# Patient Record
Sex: Female | Born: 1950 | Race: White | Hispanic: No | Marital: Married | Smoking: Former smoker
Health system: Southern US, Community
[De-identification: ages and names within clinical notes are randomized; demographics above are authoritative.]

## PROBLEM LIST (undated history)

## (undated) DIAGNOSIS — I10 Essential (primary) hypertension: Secondary | ICD-10-CM

## (undated) DIAGNOSIS — G4730 Sleep apnea, unspecified: Secondary | ICD-10-CM

## (undated) DIAGNOSIS — C801 Malignant (primary) neoplasm, unspecified: Secondary | ICD-10-CM

## (undated) DIAGNOSIS — F419 Anxiety disorder, unspecified: Secondary | ICD-10-CM

## (undated) DIAGNOSIS — IMO0001 Reserved for inherently not codable concepts without codable children: Secondary | ICD-10-CM

## (undated) DIAGNOSIS — J449 Chronic obstructive pulmonary disease, unspecified: Secondary | ICD-10-CM

## (undated) DIAGNOSIS — F32A Depression, unspecified: Secondary | ICD-10-CM

## (undated) DIAGNOSIS — F329 Major depressive disorder, single episode, unspecified: Secondary | ICD-10-CM

## (undated) DIAGNOSIS — R51 Headache: Secondary | ICD-10-CM

## (undated) DIAGNOSIS — D649 Anemia, unspecified: Secondary | ICD-10-CM

## (undated) DIAGNOSIS — K589 Irritable bowel syndrome without diarrhea: Secondary | ICD-10-CM

## (undated) DIAGNOSIS — Z5189 Encounter for other specified aftercare: Secondary | ICD-10-CM

## (undated) DIAGNOSIS — K219 Gastro-esophageal reflux disease without esophagitis: Secondary | ICD-10-CM

## (undated) DIAGNOSIS — R0602 Shortness of breath: Secondary | ICD-10-CM

## (undated) HISTORY — PX: JOINT REPLACEMENT: SHX530

## (undated) HISTORY — PX: TENDON REPAIR: SHX5111

## (undated) HISTORY — PX: CARDIOVASCULAR STRESS TEST: SHX262

## (undated) HISTORY — PX: TONSILLECTOMY: SUR1361

## (undated) HISTORY — PX: US ECHOCARDIOGRAPHY: HXRAD669

## (undated) HISTORY — PX: SKIN CANCER EXCISION: SHX779

---

## 1979-08-10 HISTORY — PX: ABDOMINAL HYSTERECTOMY: SHX81

## 1993-08-09 HISTORY — PX: FINGER SURGERY: SHX640

## 1996-08-09 HISTORY — PX: CARPAL TUNNEL RELEASE: SHX101

## 1998-05-27 ENCOUNTER — Ambulatory Visit (HOSPITAL_BASED_OUTPATIENT_CLINIC_OR_DEPARTMENT_OTHER): Admission: RE | Admit: 1998-05-27 | Discharge: 1998-05-27 | Payer: Self-pay | Admitting: Orthopedic Surgery

## 1998-10-07 ENCOUNTER — Ambulatory Visit (HOSPITAL_BASED_OUTPATIENT_CLINIC_OR_DEPARTMENT_OTHER): Admission: RE | Admit: 1998-10-07 | Discharge: 1998-10-07 | Payer: Self-pay | Admitting: Orthopedic Surgery

## 1999-08-10 HISTORY — PX: CERVICAL FUSION: SHX112

## 2002-08-09 HISTORY — PX: KNEE ARTHROSCOPY: SUR90

## 2008-08-09 HISTORY — PX: BACK SURGERY: SHX140

## 2008-08-09 HISTORY — PX: EYE SURGERY: SHX253

## 2009-04-24 ENCOUNTER — Encounter: Admission: RE | Admit: 2009-04-24 | Discharge: 2009-04-24 | Payer: Self-pay | Admitting: Orthopedic Surgery

## 2009-06-10 ENCOUNTER — Inpatient Hospital Stay (HOSPITAL_COMMUNITY): Admission: RE | Admit: 2009-06-10 | Discharge: 2009-06-13 | Payer: Self-pay | Admitting: Orthopedic Surgery

## 2009-08-09 HISTORY — PX: CHOLECYSTECTOMY: SHX55

## 2009-11-28 IMAGING — CR DG KNEE 1-2V PORT*L*
2 series · 2 of 2 positions shown · non-contrast
Comparison: 04/24/2009

CLINICAL DATA: Left knee replacement.

PORTABLE LEFT KNEE - 1-2 VIEW

[view not recorded (1 of 2)]
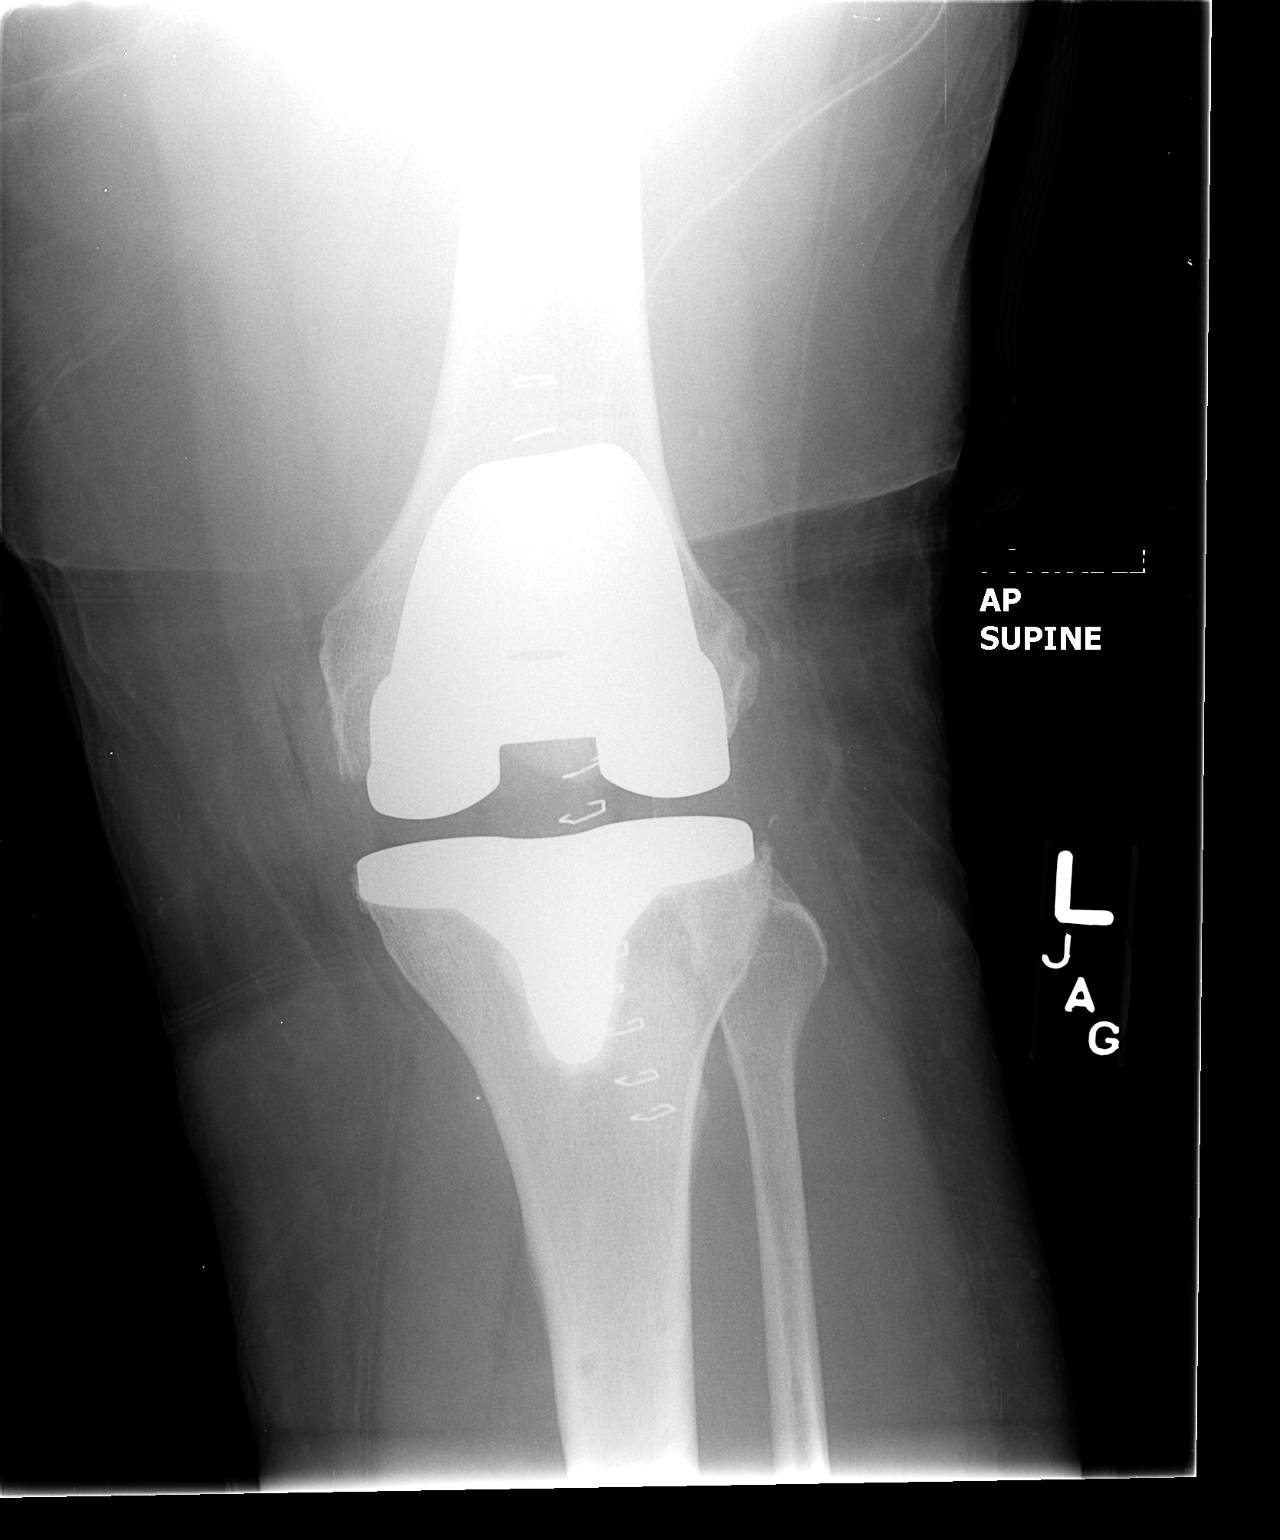

[view not recorded (2 of 2)]
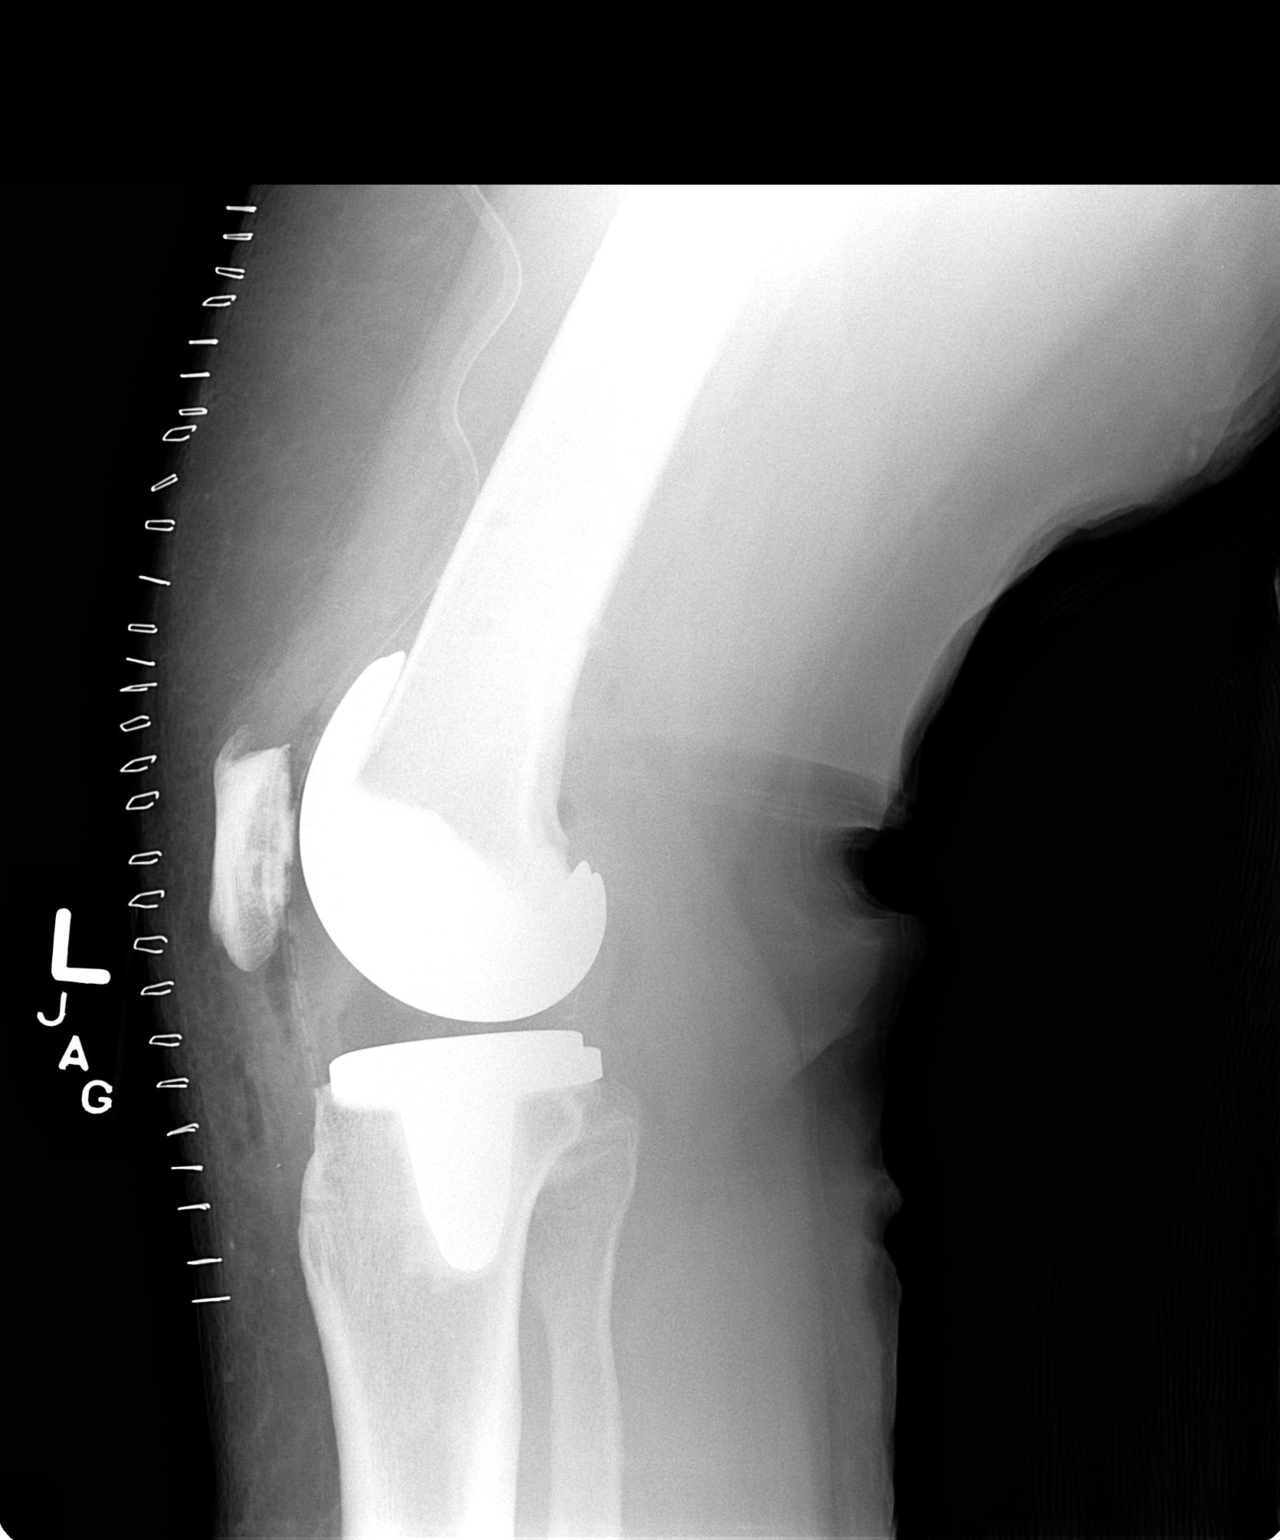

[2 of 2 positions shown; findings below may reference images not displayed]

FINDINGS: The patient is status post left total knee replacement.
No hardware or bony complicating feature.  Soft tissue drain in
place.  There is gas within the left knee joint and soft tissues.
IMPRESSION: Left knee replacement without complicating feature.

## 2009-11-28 IMAGING — CR DG CHEST 2V
2 series · 2 of 2 positions shown · non-contrast
Comparison: None

CLINICAL DATA: Preop for left knee osteoarthritis.

CHEST - 2 VIEW

[view not recorded (1 of 2)]
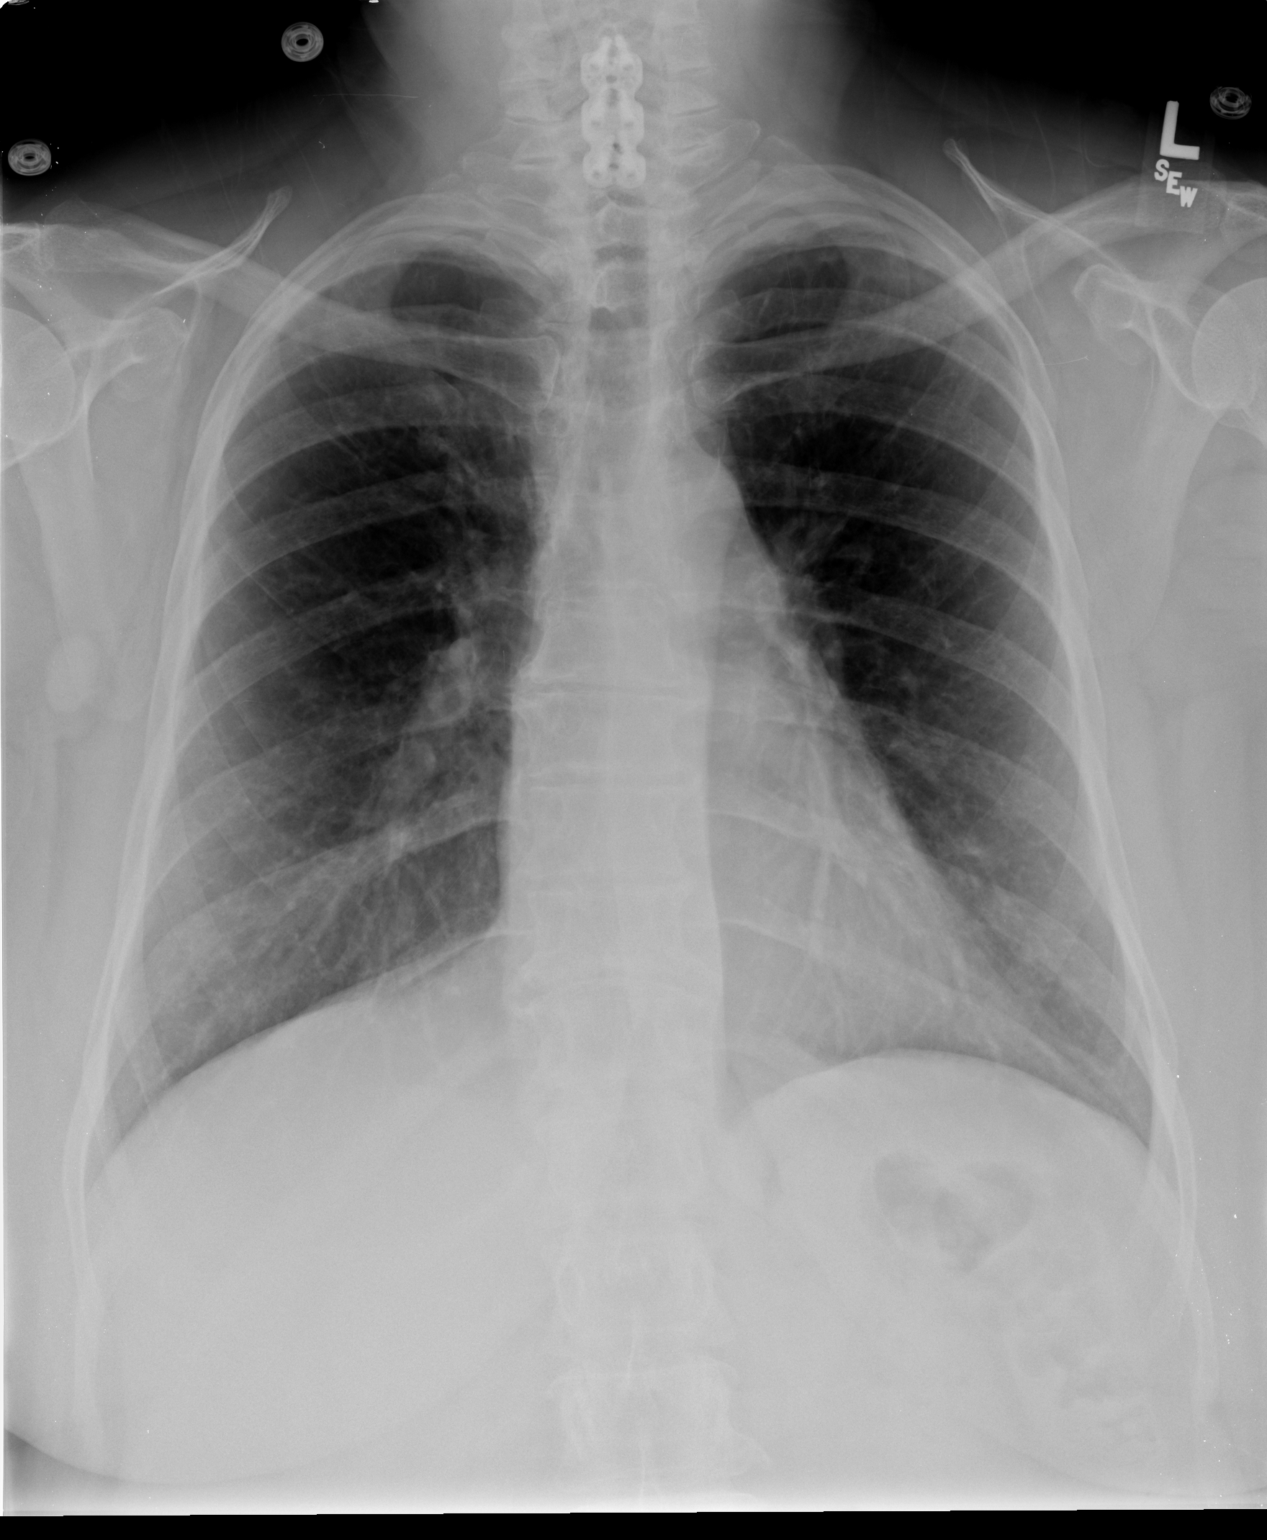

[view not recorded (2 of 2)]
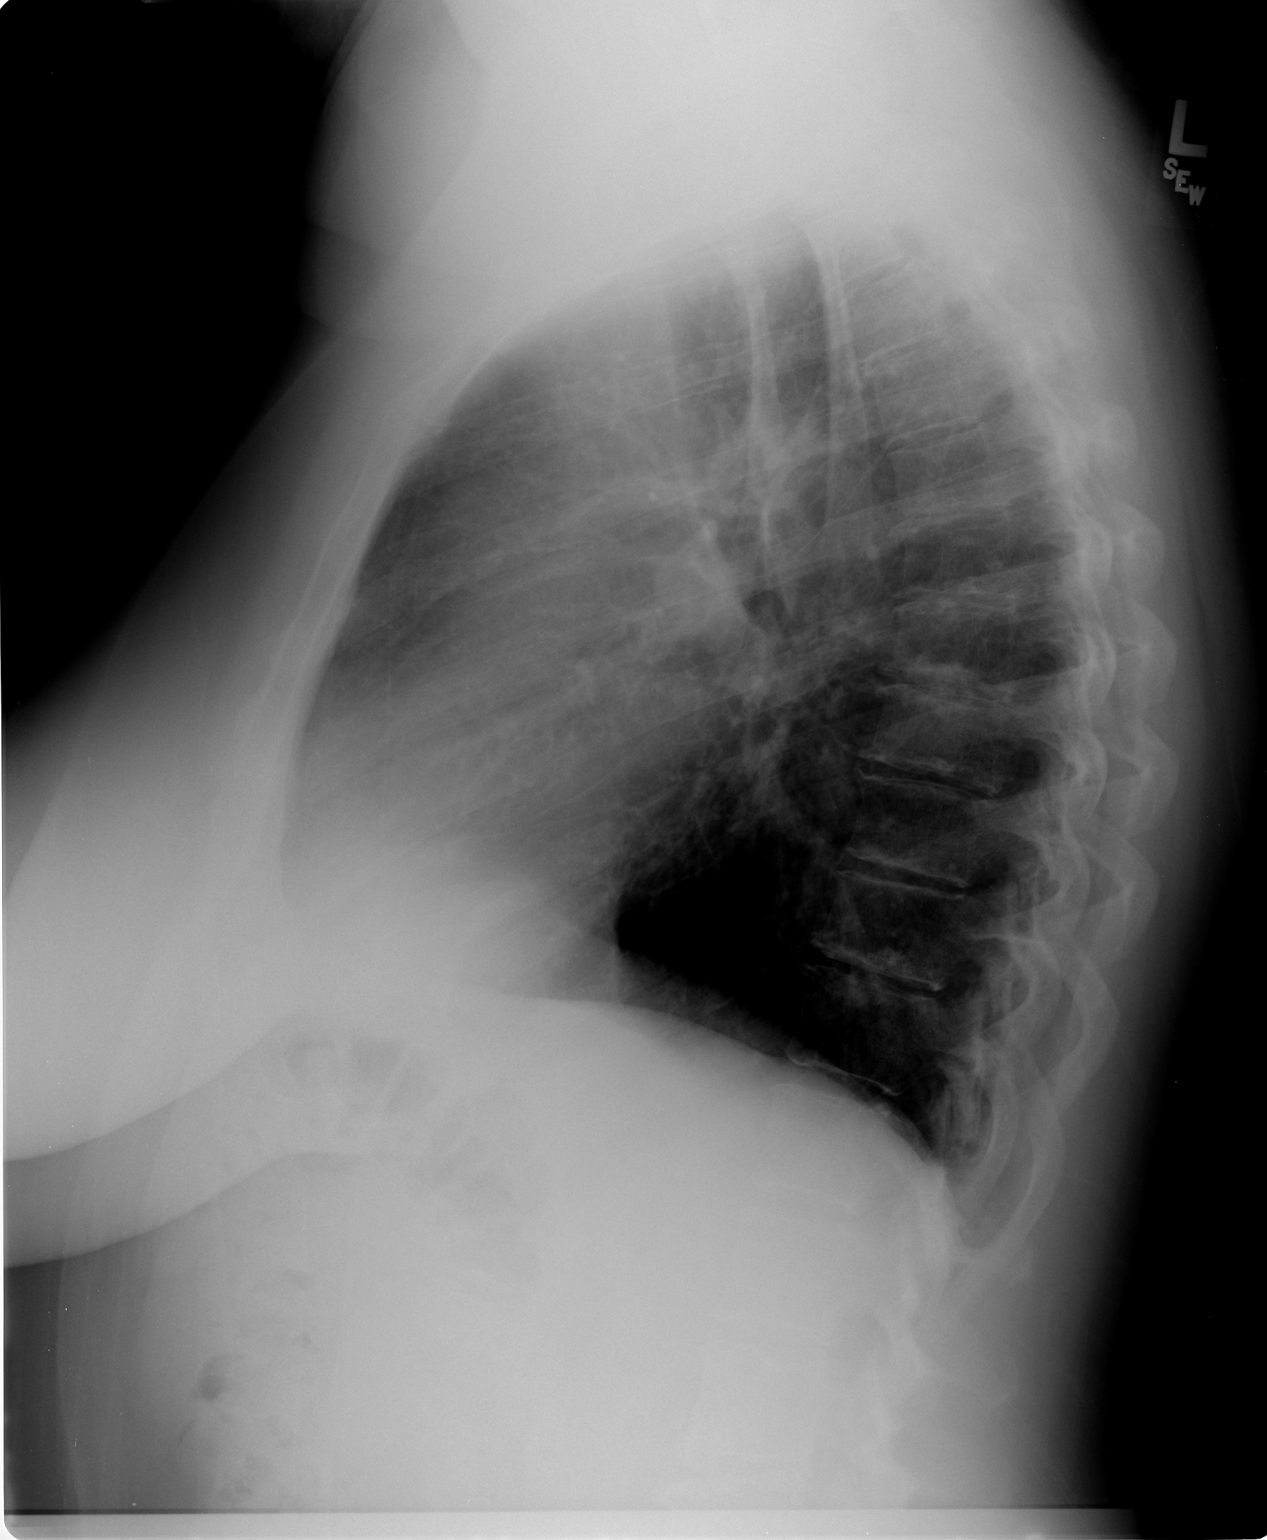

[2 of 2 positions shown; findings below may reference images not displayed]

FINDINGS: Multilevel thoracic spondylosis.  Probable calcified
right axillary lymph node.  Lower cervical spine fixation. Midline
trachea.  Normal heart size and mediastinal contours. Biapical
pleural thickening. No pleural effusion or pneumothorax.  Diffuse
peribronchial thickening.
IMPRESSION: 1.  No acute cardiopulmonary disease.
2.  Mild peribronchial thickening which may relate to chronic
bronchitis or smoking.

## 2010-11-11 LAB — BASIC METABOLIC PANEL
BUN: 12 mg/dL (ref 6–23)
CO2: 26 mEq/L (ref 19–32)
Calcium: 8.1 mg/dL — ABNORMAL LOW (ref 8.4–10.5)
Chloride: 102 mEq/L (ref 96–112)
GFR calc Af Amer: 46 mL/min — ABNORMAL LOW (ref >60)
GFR calc Af Amer: >60 mL/min (ref >60)
Glucose, Bld: 115 mg/dL — ABNORMAL HIGH (ref 70–99)
Glucose, Bld: 121 mg/dL — ABNORMAL HIGH (ref 70–99)
Potassium: 4 mEq/L (ref 3.5–5.1)
Potassium: 4 mEq/L (ref 3.5–5.1)
Sodium: 134 mEq/L — ABNORMAL LOW (ref 135–145)

## 2010-11-11 LAB — CBC
HCT: 26.6 % — ABNORMAL LOW (ref 36.0–46.0)
HCT: 27.7 % — ABNORMAL LOW (ref 36.0–46.0)
Hemoglobin: 9 g/dL — ABNORMAL LOW (ref 12.0–15.0)
Hemoglobin: 9.1 g/dL — ABNORMAL LOW (ref 12.0–15.0)
Hemoglobin: 9.4 g/dL — ABNORMAL LOW (ref 12.0–15.0)
Platelets: 224 10*3/uL (ref 150–400)
Platelets: 261 10*3/uL (ref 150–400)
RDW: 13.5 % (ref 11.5–15.5)
WBC: 10.7 10*3/uL — ABNORMAL HIGH (ref 4.0–10.5)
WBC: 12.2 10*3/uL — ABNORMAL HIGH (ref 4.0–10.5)

## 2010-11-11 LAB — PROTIME-INR
INR: 1.63 — ABNORMAL HIGH (ref 0.00–1.49)
Prothrombin Time: 15.7 seconds — ABNORMAL HIGH (ref 11.6–15.2)
Prothrombin Time: 19.2 seconds — ABNORMAL HIGH (ref 11.6–15.2)
Prothrombin Time: 20.6 seconds — ABNORMAL HIGH (ref 11.6–15.2)

## 2010-11-12 LAB — BASIC METABOLIC PANEL
BUN: 21 mg/dL (ref 6–23)
CO2: 26 mEq/L (ref 19–32)
Calcium: 9.6 mg/dL (ref 8.4–10.5)
Chloride: 106 mEq/L (ref 96–112)
GFR calc Af Amer: >60 mL/min (ref >60)
GFR calc non Af Amer: 57 mL/min — ABNORMAL LOW (ref >60)
Glucose, Bld: 122 mg/dL — ABNORMAL HIGH (ref 70–99)

## 2010-11-12 LAB — CBC
Hemoglobin: 11.4 g/dL — ABNORMAL LOW (ref 12.0–15.0)
MCHC: 34.3 g/dL (ref 30.0–36.0)
MCV: 92.7 fL (ref 78.0–100.0)
RBC: 3.6 MIL/uL — ABNORMAL LOW (ref 3.87–5.11)
RDW: 13.5 % (ref 11.5–15.5)
WBC: 7.3 10*3/uL (ref 4.0–10.5)

## 2010-11-12 LAB — URINALYSIS, ROUTINE W REFLEX MICROSCOPIC
Bilirubin Urine: NEGATIVE
Glucose, UA: NEGATIVE mg/dL
Hgb urine dipstick: NEGATIVE
Protein, ur: NEGATIVE mg/dL
Urobilinogen, UA: 0.2 mg/dL (ref 0.0–1.0)
pH: 5.5 (ref 5.0–8.0)

## 2010-11-12 LAB — TYPE AND SCREEN

## 2010-11-12 LAB — URINE CULTURE: Colony Count: NO GROWTH

## 2010-11-12 LAB — ABO/RH: ABO/RH(D): AB POS

## 2011-06-23 ENCOUNTER — Other Ambulatory Visit: Payer: Self-pay | Admitting: Orthopedic Surgery

## 2011-06-23 ENCOUNTER — Ambulatory Visit
Admission: RE | Admit: 2011-06-23 | Discharge: 2011-06-23 | Disposition: A | Discharge status: Home / Self Care | Payer: Medicare (Managed Care) | Source: Ambulatory Visit | Attending: Orthopedic Surgery | Admitting: Orthopedic Surgery

## 2011-06-23 DIAGNOSIS — M25511 Pain in right shoulder: Secondary | ICD-10-CM

## 2011-06-29 ENCOUNTER — Encounter (HOSPITAL_COMMUNITY): Payer: Self-pay | Admitting: Pharmacy Technician

## 2011-07-05 NOTE — H&P (Signed)
[name], Hinton               ACCOUNT NO.:  [id number]  MEDICAL RECORD NO.:  [id number]  LOCATION:  PERIO                        FACILITY:  [facility]  PHYSICIAN:  Burnard Bunting, M.D.    DATE OF BIRTH:  1950/09/09  DATE OF ADMISSION:  06/28/2011 DATE OF DISCHARGE:                             HISTORY & PHYSICAL   CHIEF COMPLAINT:  Right shoulder pain.  HISTORY OF PRESENT ILLNESS:  Brenda Hinton is a 60 year old patient with right shoulder pain.  Her old notes were reviewed.  She describes significant pain in the right shoulder.  She has had an injection May 26, 2011 which helped some but she states not a lot.  She had to discontinue Mobic.  She reports pain primarily in the anterolateral aspect of the shoulder.  She reports some weakness as well as mechanical symptoms of grinding and crepitus in the right shoulder.  Since I have seen her, she has had an MRI scan.  She has been taking over-the-counter medication but it has not been very effective.  She does have some pain at night as well.  The patient has been taking Cymbalta.  She has had bilateral knee replacements, carpal tunnel surgery in both hands.  FAMILY MEDICAL HISTORY:  Positive for diabetes and cancer.  There is no family history of deep vein thrombosis.  SOCIAL HISTORY:  The patient is married.  She is on disability.  Social history is otherwise as stated and unchanged.  REVIEW OF SYSTEMS:  All systems reviewed and negative and they relate to the shoulder.  PHYSICAL EXAMINATION:  GENERAL:  She is  well developed, well nourished, in no acute distress.  Alert, oriented. VITAL SIGNS:  Normal body mass index.  She is 5 feet 4 inches, weight 225, blood pressure 148/89, BMI 38. LUNGS:  Clear to auscultation. EXTREMITIES:  She has C-spine exam normal.  Right shoulder exam demonstrates past range of motion.  She does have some weakness in supraspinatus testing.  She has maintained external rotation to 15 degrees  abduction.  No other masses, lymphadenopathy, or skin changes noted in the right shoulder region.  IMPRESSION:  Right shoulder pain, night pain, rest pain refractory to nonoperative management.  MRI scan is reviewed.  It does show full- thickness partial width tear of the anterior leading edge of the supraspinatus, indistinctness of the biceps tendon which likely represents partial tearing, moderate degenerative AC joint arthropathy . I re-examined this area.  She has fairly minimal tenderness to the [facility] joint.  Most of her tenderness is in the anterolateral aspect of the shoulder.  IMPRESSION:  Right shoulder rotator cuff tear and likely biceps tendon pathology.  PLAN:  Right shoulder arthroscopy, subacromial decompression, mini open rotator cuff repair, and biceps tenodesis.  Risks and benefits discussed with the patient to include but not limited to infection, nerve or vessel damage, shoulder stiffness.  It is about 3-4 months rehab.  That will require therapy as well as CPM machine for the first 2 weeks.  The patient understands risks and benefits and wishes to proceed with surgery.  I think she got about an 80% chance of improving her situation.  I do think natural history wise  that this will likely continue to slowly enlarge.  All questions were answered.  Medical decision making is complicated by decision for surgery.     Burnard Bunting, M.D.     GSD/MEDQ  D:  07/05/2011  T:  07/05/2011  Job:  [id number]

## 2011-07-05 NOTE — H&P (Signed)
  D [phone]

## 2011-07-07 ENCOUNTER — Other Ambulatory Visit: Payer: Self-pay

## 2011-07-07 ENCOUNTER — Encounter (HOSPITAL_COMMUNITY)
Admission: RE | Admit: 2011-07-07 | Discharge: 2011-07-07 | Disposition: A | Discharge status: Home / Self Care | Payer: Medicare (Managed Care) | Source: Ambulatory Visit | Attending: Orthopedic Surgery | Admitting: Orthopedic Surgery

## 2011-07-07 ENCOUNTER — Encounter (HOSPITAL_COMMUNITY): Payer: Self-pay

## 2011-07-07 HISTORY — DX: Reserved for inherently not codable concepts without codable children: IMO0001

## 2011-07-07 HISTORY — DX: Encounter for other specified aftercare: Z51.89

## 2011-07-07 HISTORY — DX: Malignant (primary) neoplasm, unspecified: C80.1

## 2011-07-07 HISTORY — DX: Depression, unspecified: F32.A

## 2011-07-07 HISTORY — DX: Sleep apnea, unspecified: G47.30

## 2011-07-07 HISTORY — DX: Major depressive disorder, single episode, unspecified: F32.9

## 2011-07-07 HISTORY — DX: Gastro-esophageal reflux disease without esophagitis: K21.9

## 2011-07-07 HISTORY — DX: Chronic obstructive pulmonary disease, unspecified: J44.9

## 2011-07-07 HISTORY — DX: Essential (primary) hypertension: I10

## 2011-07-07 HISTORY — DX: Anemia, unspecified: D64.9

## 2011-07-07 HISTORY — DX: Shortness of breath: R06.02

## 2011-07-07 HISTORY — DX: Anxiety disorder, unspecified: F41.9

## 2011-07-07 HISTORY — DX: Irritable bowel syndrome, unspecified: K58.9

## 2011-07-07 HISTORY — DX: Headache: R51

## 2011-07-07 LAB — CBC
HCT: 40.2 % (ref 36.0–46.0)
Hemoglobin: 13.1 g/dL (ref 12.0–15.0)
MCH: 31.5 pg (ref 26.0–34.0)
MCHC: 32.6 g/dL (ref 30.0–36.0)
RDW: 13.5 % (ref 11.5–15.5)

## 2011-07-07 LAB — BASIC METABOLIC PANEL WITH GFR
BUN: 12 mg/dL (ref 6–23)
CO2: 27 meq/L (ref 19–32)
Calcium: 9.3 mg/dL (ref 8.4–10.5)
Chloride: 105 meq/L (ref 96–112)
Creatinine, Ser: 0.85 mg/dL (ref 0.50–1.10)
GFR calc Af Amer: 86 mL/min — ABNORMAL LOW
GFR calc non Af Amer: 74 mL/min — ABNORMAL LOW
Glucose, Bld: 108 mg/dL — ABNORMAL HIGH (ref 70–99)
Potassium: 3.2 meq/L — ABNORMAL LOW (ref 3.5–5.1)
Sodium: 142 meq/L (ref 135–145)

## 2011-07-07 LAB — SURGICAL PCR SCREEN
MRSA, PCR: NEGATIVE
Staphylococcus aureus: NEGATIVE

## 2011-07-07 MED ORDER — CHLORHEXIDINE GLUCONATE 4 % EX LIQD: 60.0000 mL | Freq: Once | CUTANEOUS | Status: DC

## 2011-07-07 NOTE — Pre-Procedure Instructions (Signed)
20 Brenda Hinton  07/07/2011   Your procedure is scheduled on: Tuesday July 13, 2011  Report to [facility] at 05:30 AM.  Call this number if you have problems the morning of surgery: [phone]   Remember:   Do not eat food:After Midnight.  May have clear liquids: up to 4 Hours before arrival. (1:30 AM)  Clear liquids include soda, tea, black coffee, apple or grape juice, broth.  Take these medicines the morning of surgery with A SIP OF WATER: PRILOSEC, ZOLOFT, KLONOPIN, TOPOMAX   Do not wear jewelry, make-up or nail polish.  Do not wear lotions, powders, or perfumes. You may wear deodorant.  Do not shave 48 hours prior to surgery.  Do not bring valuables to the hospital.  Contacts, dentures or bridgework may not be worn into surgery.  Leave suitcase in the car. After surgery it may be brought to your room.  For patients admitted to the hospital, checkout time is 11:00 AM the day of discharge.   Patients discharged the day of surgery will not be allowed to drive home.  Name and phone number of your driver: N/A  Special Instructions: CHG Shower Use Special Wash: 1/2 bottle night before surgery and 1/2 bottle morning of surgery.   Please read over the following fact sheets that you were given: Pain Booklet, Coughing and Deep Breathing, MRSA Information and Surgical Site Infection Prevention, Hibiclens and Mupirocin Info Sheets.

## 2011-07-07 NOTE — Progress Notes (Signed)
EKG to be reviewed by Shonna Chock, PA.

## 2011-07-08 NOTE — Consult Note (Signed)
Anesthesia:  60 year old female for right shoulder arthroscopy, rotator cuff repair.  Hx + for HTN, COPD, former smoker, anxiety, depression, OSA, GERD, skin CA, acute renal failure (not requiring HD) following cholecystectomy at [facility] last year.  I was asked to review her preoperative EKG showing NSR with nonspecific ST abnormality.  Patient denies CP, SOB at rest.  She has chronic dyspnea with moderate exertion.  She tolerated a TKA in 2010, and says she has had no new symptoms.  She did see Cardiologist Dr. Earna Coder in [place], [place] over 5 years ago for SOB, which she says is improved now.  She did undergo some diagnostic testing then including a stress and echo which were reportedly normal.  Her preoperative Cr is WNL.  CXR also noted.  Plan to proceed.

## 2011-07-12 MED ORDER — CEFAZOLIN SODIUM-DEXTROSE 2-3 GM-% IV SOLR
2.0000 g | INTRAVENOUS | Status: AC
  Administered 2011-07-13: 2 g via INTRAVENOUS
  Filled 2011-07-12: qty 50

## 2011-07-13 ENCOUNTER — Inpatient Hospital Stay (HOSPITAL_COMMUNITY)
Admission: RE | Admit: 2011-07-13 | Discharge: 2011-07-14 | DRG: 497 | Disposition: A | Discharge status: Home / Self Care | Payer: Medicare (Managed Care) | Source: Ambulatory Visit | Attending: Orthopedic Surgery | Admitting: Orthopedic Surgery

## 2011-07-13 ENCOUNTER — Encounter (HOSPITAL_COMMUNITY): Payer: Self-pay | Admitting: Vascular Surgery

## 2011-07-13 ENCOUNTER — Encounter (HOSPITAL_COMMUNITY)
Admission: RE | Disposition: A | Discharge status: Home / Self Care | Payer: Self-pay | Source: Ambulatory Visit | Attending: Orthopedic Surgery

## 2011-07-13 ENCOUNTER — Encounter (HOSPITAL_COMMUNITY): Payer: Self-pay | Admitting: Anesthesiology

## 2011-07-13 ENCOUNTER — Encounter (HOSPITAL_COMMUNITY): Payer: Self-pay | Admitting: *Deleted

## 2011-07-13 ENCOUNTER — Ambulatory Visit (HOSPITAL_COMMUNITY): Payer: Medicare (Managed Care) | Admitting: Vascular Surgery

## 2011-07-13 DIAGNOSIS — M67919 Unspecified disorder of synovium and tendon, unspecified shoulder: Secondary | ICD-10-CM | POA: Diagnosis present

## 2011-07-13 DIAGNOSIS — M659 Unspecified synovitis and tenosynovitis, unspecified site: Secondary | ICD-10-CM | POA: Diagnosis present

## 2011-07-13 DIAGNOSIS — Z01812 Encounter for preprocedural laboratory examination: Secondary | ICD-10-CM

## 2011-07-13 DIAGNOSIS — Z01818 Encounter for other preprocedural examination: Secondary | ICD-10-CM

## 2011-07-13 DIAGNOSIS — S43429A Sprain of unspecified rotator cuff capsule, initial encounter: Principal | ICD-10-CM | POA: Diagnosis present

## 2011-07-13 DIAGNOSIS — M75101 Unspecified rotator cuff tear or rupture of right shoulder, not specified as traumatic: Secondary | ICD-10-CM

## 2011-07-13 DIAGNOSIS — Z79899 Other long term (current) drug therapy: Secondary | ICD-10-CM

## 2011-07-13 DIAGNOSIS — Z01810 Encounter for preprocedural cardiovascular examination: Secondary | ICD-10-CM

## 2011-07-13 DIAGNOSIS — M719 Bursopathy, unspecified: Secondary | ICD-10-CM | POA: Diagnosis present

## 2011-07-13 DIAGNOSIS — X58XXXA Exposure to other specified factors, initial encounter: Secondary | ICD-10-CM | POA: Diagnosis present

## 2011-07-13 SURGERY — SHOULDER ARTHROSCOPY WITH ROTATOR CUFF REPAIR AND SUBACROMIAL DECOMPRESSION
Anesthesia: General | Site: Shoulder | Laterality: Right | Wound class: Clean

## 2011-07-13 MED ORDER — MENTHOL 3 MG MT LOZG: 1.0000 | LOZENGE | OROMUCOSAL | Status: DC | PRN

## 2011-07-13 MED ORDER — TEMAZEPAM 15 MG PO CAPS: 15.0000 mg | ORAL_CAPSULE | Freq: Every evening | ORAL | Status: DC | PRN

## 2011-07-13 MED ORDER — ONDANSETRON HCL 4 MG/2ML IJ SOLN
INTRAMUSCULAR | Status: DC | PRN
  Administered 2011-07-13: 4 mg via INTRAVENOUS

## 2011-07-13 MED ORDER — HYDROMORPHONE HCL PF 1 MG/ML IJ SOLN
0.2500 mg | INTRAMUSCULAR | Status: DC | PRN
  Administered 2011-07-13 (×2): 0.5 mg via INTRAVENOUS

## 2011-07-13 MED ORDER — CEFAZOLIN SODIUM 1-5 GM-% IV SOLN
1.0000 g | Freq: Three times a day (TID) | INTRAVENOUS | Status: AC
  Administered 2011-07-13 – 2011-07-14 (×3): 1 g via INTRAVENOUS
  Filled 2011-07-13 (×3): qty 50

## 2011-07-13 MED ORDER — BUPIVACAINE-EPINEPHRINE PF 0.5-1:200000 % IJ SOLN
INTRAMUSCULAR | Status: DC | PRN
  Administered 2011-07-13: 15 mL

## 2011-07-13 MED ORDER — DICLOFENAC SODIUM 75 MG PO TBEC
75.0000 mg | DELAYED_RELEASE_TABLET | Freq: Two times a day (BID) | ORAL | Status: DC
  Administered 2011-07-13 – 2011-07-14 (×2): 75 mg via ORAL
  Filled 2011-07-13 (×3): qty 1

## 2011-07-13 MED ORDER — PANTOPRAZOLE SODIUM 40 MG PO TBEC
40.0000 mg | DELAYED_RELEASE_TABLET | Freq: Every day | ORAL | Status: DC
  Administered 2011-07-13 – 2011-07-14 (×2): 40 mg via ORAL
  Filled 2011-07-13 (×2): qty 1

## 2011-07-13 MED ORDER — METHOCARBAMOL 500 MG PO TABS
500.0000 mg | ORAL_TABLET | Freq: Four times a day (QID) | ORAL | Status: DC | PRN
  Filled 2011-07-13: qty 1

## 2011-07-13 MED ORDER — NALOXONE HCL 0.4 MG/ML IJ SOLN
0.4000 mg | INTRAMUSCULAR | Status: DC | PRN
  Filled 2011-07-13: qty 1

## 2011-07-13 MED ORDER — ONDANSETRON HCL 4 MG/2ML IJ SOLN: 4.0000 mg | Freq: Four times a day (QID) | INTRAMUSCULAR | Status: DC | PRN

## 2011-07-13 MED ORDER — NEOSTIGMINE METHYLSULFATE 1 MG/ML IJ SOLN
INTRAMUSCULAR | Status: DC | PRN
  Administered 2011-07-13: 2 mg via INTRAVENOUS

## 2011-07-13 MED ORDER — SODIUM CHLORIDE 0.9 % IJ SOLN
INTRAMUSCULAR | Status: DC | PRN
  Administered 2011-07-13: 20 mL via INTRAVENOUS

## 2011-07-13 MED ORDER — PROPOFOL 10 MG/ML IV EMUL
INTRAVENOUS | Status: DC | PRN
  Administered 2011-07-13: 200 mg via INTRAVENOUS

## 2011-07-13 MED ORDER — SERTRALINE HCL 100 MG PO TABS
100.0000 mg | ORAL_TABLET | Freq: Every day | ORAL | Status: DC
  Administered 2011-07-14: 100 mg via ORAL
  Filled 2011-07-13: qty 1

## 2011-07-13 MED ORDER — CHOLESTYRAMINE 4 G PO PACK
1.0000 | PACK | ORAL | Status: DC
  Administered 2011-07-14: 1 via ORAL
  Filled 2011-07-13 (×2): qty 1

## 2011-07-13 MED ORDER — EPINEPHRINE HCL 1 MG/ML IJ SOLN
INTRAMUSCULAR | Status: DC | PRN
  Administered 2011-07-13: 1 mg via INTRAMUSCULAR

## 2011-07-13 MED ORDER — ACETAMINOPHEN 325 MG PO TABS
650.0000 mg | ORAL_TABLET | Freq: Four times a day (QID) | ORAL | Status: DC | PRN
  Administered 2011-07-14: 650 mg via ORAL
  Filled 2011-07-13: qty 2

## 2011-07-13 MED ORDER — FENTANYL CITRATE 0.05 MG/ML IJ SOLN
INTRAMUSCULAR | Status: DC | PRN
  Administered 2011-07-13 (×3): 50 ug via INTRAVENOUS
  Administered 2011-07-13: 150 ug via INTRAVENOUS
  Administered 2011-07-13 (×3): 50 ug via INTRAVENOUS

## 2011-07-13 MED ORDER — PHENTERMINE HCL 37.5 MG PO CAPS
37.5000 mg | ORAL_CAPSULE | ORAL | Status: DC
  Filled 2011-07-13: qty 1

## 2011-07-13 MED ORDER — METOCLOPRAMIDE HCL 10 MG PO TABS: 5.0000 mg | ORAL_TABLET | Freq: Three times a day (TID) | ORAL | Status: DC | PRN

## 2011-07-13 MED ORDER — POTASSIUM CHLORIDE IN NACL 20-0.9 MEQ/L-% IV SOLN
INTRAVENOUS | Status: DC
  Filled 2011-07-13 (×3): qty 1000

## 2011-07-13 MED ORDER — GLYCOPYRROLATE 0.2 MG/ML IJ SOLN
INTRAMUSCULAR | Status: DC | PRN
  Administered 2011-07-13: 0.2 mg via INTRAVENOUS

## 2011-07-13 MED ORDER — PHENOL 1.4 % MT LIQD
1.0000 | OROMUCOSAL | Status: DC | PRN
  Filled 2011-07-13: qty 177

## 2011-07-13 MED ORDER — CHOLECALCIFEROL 10 MCG (400 UNIT) PO TABS
800.0000 [IU] | ORAL_TABLET | Freq: Two times a day (BID) | ORAL | Status: DC
  Administered 2011-07-13 – 2011-07-14 (×2): 800 [IU] via ORAL
  Filled 2011-07-13 (×3): qty 2

## 2011-07-13 MED ORDER — LISINOPRIL-HYDROCHLOROTHIAZIDE 20-12.5 MG PO TABS: 1.0000 | ORAL_TABLET | Freq: Every day | ORAL | Status: DC

## 2011-07-13 MED ORDER — PHENYLEPHRINE HCL 10 MG/ML IJ SOLN
10.0000 mg | INTRAVENOUS | Status: DC | PRN
  Administered 2011-07-13: 10 ug/min via INTRAVENOUS

## 2011-07-13 MED ORDER — SODIUM CHLORIDE 0.9 % IJ SOLN: 9.0000 mL | INTRAMUSCULAR | Status: DC | PRN

## 2011-07-13 MED ORDER — TOPIRAMATE 25 MG PO TABS
50.0000 mg | ORAL_TABLET | Freq: Every day | ORAL | Status: DC
  Administered 2011-07-14: 50 mg via ORAL
  Filled 2011-07-13: qty 2

## 2011-07-13 MED ORDER — LACTATED RINGERS IV SOLN
INTRAVENOUS | Status: DC | PRN
  Administered 2011-07-13 (×3): via INTRAVENOUS

## 2011-07-13 MED ORDER — ROCURONIUM BROMIDE 100 MG/10ML IV SOLN
INTRAVENOUS | Status: DC | PRN
  Administered 2011-07-13: 50 mg via INTRAVENOUS

## 2011-07-13 MED ORDER — METOCLOPRAMIDE HCL 5 MG/ML IJ SOLN
5.0000 mg | Freq: Three times a day (TID) | INTRAMUSCULAR | Status: DC | PRN
  Filled 2011-07-13: qty 2

## 2011-07-13 MED ORDER — CLONAZEPAM 0.5 MG PO TABS: 0.2500 mg | ORAL_TABLET | Freq: Two times a day (BID) | ORAL | Status: DC | PRN

## 2011-07-13 MED ORDER — HYDROMORPHONE 0.3 MG/ML IV SOLN
INTRAVENOUS | Status: DC
  Administered 2011-07-13: 7.5 mg via INTRAVENOUS
  Administered 2011-07-14: 1.2 mg via INTRAVENOUS

## 2011-07-13 MED ORDER — ONDANSETRON HCL 4 MG PO TABS: 4.0000 mg | ORAL_TABLET | Freq: Four times a day (QID) | ORAL | Status: DC | PRN

## 2011-07-13 MED ORDER — METHOCARBAMOL 100 MG/ML IJ SOLN
500.0000 mg | Freq: Four times a day (QID) | INTRAVENOUS | Status: DC | PRN
  Administered 2011-07-13: 500 mg via INTRAVENOUS
  Filled 2011-07-13: qty 5

## 2011-07-13 MED ORDER — LACTATED RINGERS IV SOLN
INTRAVENOUS | Status: DC
  Administered 2011-07-13: 11:00:00 via INTRAVENOUS

## 2011-07-13 MED ORDER — ACETAMINOPHEN 650 MG RE SUPP: 650.0000 mg | Freq: Four times a day (QID) | RECTAL | Status: DC | PRN

## 2011-07-13 MED ORDER — LISINOPRIL 20 MG PO TABS
20.0000 mg | ORAL_TABLET | Freq: Every day | ORAL | Status: DC
  Administered 2011-07-14: 20 mg via ORAL
  Filled 2011-07-13: qty 1

## 2011-07-13 MED ORDER — ONDANSETRON HCL 4 MG/2ML IJ SOLN
4.0000 mg | Freq: Four times a day (QID) | INTRAMUSCULAR | Status: DC | PRN
  Filled 2011-07-13: qty 2

## 2011-07-13 MED ORDER — DIPHENHYDRAMINE HCL 12.5 MG/5ML PO ELIX
12.5000 mg | ORAL_SOLUTION | Freq: Four times a day (QID) | ORAL | Status: DC | PRN
  Filled 2011-07-13: qty 5

## 2011-07-13 MED ORDER — SODIUM CHLORIDE 0.9 % IR SOLN
Status: DC | PRN
  Administered 2011-07-13: 3000 mL

## 2011-07-13 MED ORDER — DIPHENHYDRAMINE HCL 50 MG/ML IJ SOLN
12.5000 mg | Freq: Four times a day (QID) | INTRAMUSCULAR | Status: DC | PRN
  Filled 2011-07-13: qty 0.25

## 2011-07-13 MED ORDER — ONDANSETRON HCL 4 MG/2ML IJ SOLN: 4.0000 mg | Freq: Once | INTRAMUSCULAR | Status: DC | PRN

## 2011-07-13 MED ORDER — HYDROCHLOROTHIAZIDE 12.5 MG PO CAPS
12.5000 mg | ORAL_CAPSULE | Freq: Every day | ORAL | Status: DC
  Administered 2011-07-14: 12.5 mg via ORAL
  Filled 2011-07-13: qty 1

## 2011-07-13 SURGICAL SUPPLY — 82 items
ANCHOR CORKSCREW BIO 5.5 FT (Anchor) ×2 IMPLANT
BENZOIN TINCTURE PRP APPL 2/3 (GAUZE/BANDAGES/DRESSINGS) IMPLANT
BIT DRILL TAK (DRILL) IMPLANT
BLADE CUDA 5.5 (BLADE) IMPLANT
BLADE CUTTER GATOR 3.5 (BLADE) IMPLANT
BLADE GREAT WHITE 4.2 (BLADE) ×2 IMPLANT
BLADE SURG 11 STRL SS (BLADE) ×2 IMPLANT
BUR GATOR 2.9 (BURR) IMPLANT
BUR OVAL 6.0 (BURR) ×2 IMPLANT
CANNULA SHOULDER 7CM (CANNULA) IMPLANT
CARTRIDGE CURVETEK MED (MISCELLANEOUS) IMPLANT
CARTRIDGE CURVETEK XLRG (MISCELLANEOUS) IMPLANT
CLOSURE STERI STRIP 1/2 X4 (GAUZE/BANDAGES/DRESSINGS) ×2 IMPLANT
CLOTH BEACON ORANGE TIMEOUT ST (SAFETY) ×2 IMPLANT
COVER SURGICAL LIGHT HANDLE (MISCELLANEOUS) ×2 IMPLANT
DRAPE INCISE IOBAN 66X45 STRL (DRAPES) ×4 IMPLANT
DRAPE STERI 35X30 U-POUCH (DRAPES) ×2 IMPLANT
DRAPE U-SHAPE 47X51 STRL (DRAPES) ×4 IMPLANT
DRILL TAK (DRILL)
DRSG PAD ABDOMINAL 8X10 ST (GAUZE/BANDAGES/DRESSINGS) ×4 IMPLANT
DURAPREP 26ML APPLICATOR (WOUND CARE) ×2 IMPLANT
ELECT MENISCUS 165MM 90D (ELECTRODE) IMPLANT
ELECT REM PT RETURN 9FT ADLT (ELECTROSURGICAL) ×2
ELECTRODE REM PT RTRN 9FT ADLT (ELECTROSURGICAL) ×1 IMPLANT
FILTER STRAW FLUID ASPIR (MISCELLANEOUS) ×2 IMPLANT
GAUZE XEROFORM 1X8 LF (GAUZE/BANDAGES/DRESSINGS) IMPLANT
GLOVE BIO SURGEON ST LM GN SZ9 (GLOVE) IMPLANT
GLOVE BIOGEL PI IND STRL 6.5 (GLOVE) ×1 IMPLANT
GLOVE BIOGEL PI IND STRL 7.5 (GLOVE) ×1 IMPLANT
GLOVE BIOGEL PI IND STRL 8 (GLOVE) ×1 IMPLANT
GLOVE BIOGEL PI INDICATOR 6.5 (GLOVE) ×1
GLOVE BIOGEL PI INDICATOR 7.5 (GLOVE) ×1
GLOVE BIOGEL PI INDICATOR 8 (GLOVE) ×1
GLOVE ECLIPSE 7.0 STRL STRAW (GLOVE) ×2 IMPLANT
GLOVE SURG ORTHO 8.0 STRL STRW (GLOVE) ×2 IMPLANT
GOWN PREVENTION PLUS LG XLONG (DISPOSABLE) IMPLANT
GOWN PREVENTION PLUS XXLARGE (GOWN DISPOSABLE) ×2 IMPLANT
GOWN STRL NON-REIN LRG LVL3 (GOWN DISPOSABLE) ×4 IMPLANT
KIT BASIN OR (CUSTOM PROCEDURE TRAY) ×2 IMPLANT
KIT BIO-TENODESIS 3X8 DISP (MISCELLANEOUS) ×1
KIT INSRT BABSR STRL DISP BTN (MISCELLANEOUS) ×1 IMPLANT
KIT ROOM TURNOVER OR (KITS) ×2 IMPLANT
MANIFOLD NEPTUNE II (INSTRUMENTS) ×2 IMPLANT
NDL SUT 6 .5 CRC .975X.05 MAYO (NEEDLE) ×1 IMPLANT
NEEDLE HYPO 25X1 1.5 SAFETY (NEEDLE) ×2 IMPLANT
NEEDLE MAYO TAPER (NEEDLE) ×1
NEEDLE SPNL 18GX3.5 QUINCKE PK (NEEDLE) ×2 IMPLANT
NS IRRIG 1000ML POUR BTL (IV SOLUTION) IMPLANT
PACK SHOULDER (CUSTOM PROCEDURE TRAY) ×2 IMPLANT
PAD ARMBOARD 7.5X6 YLW CONV (MISCELLANEOUS) ×4 IMPLANT
PUSHLOCK PEEK 4.5X24 (Orthopedic Implant) ×4 IMPLANT
SCREW BIO TENODEIS 7MM (Screw) ×2 IMPLANT
SET ARTHROSCOPY TUBING (MISCELLANEOUS) ×2
SET ARTHROSCOPY TUBING LN (MISCELLANEOUS) ×2 IMPLANT
SLING ARM IMMOBILIZER LRG (SOFTGOODS) ×2 IMPLANT
SLING ARM IMMOBILIZER MED (SOFTGOODS) IMPLANT
SPEAR FASTAKII (SLEEVE) IMPLANT
SPONGE GAUZE 4X4 12PLY (GAUZE/BANDAGES/DRESSINGS) IMPLANT
SPONGE LAP 4X18 X RAY DECT (DISPOSABLE) ×4 IMPLANT
STRIP CLOSURE SKIN 1/2X4 (GAUZE/BANDAGES/DRESSINGS) IMPLANT
SUCTION FRAZIER TIP 10 FR DISP (SUCTIONS) ×2 IMPLANT
SUT ETHILON 3 0 PS 1 (SUTURE) ×2 IMPLANT
SUT FIBERWIRE #2 38 T-5 BLUE (SUTURE) ×2
SUT FIBERWIRE 2-0 18 17.9 3/8 (SUTURE) ×8
SUT PROLENE 3 0 PS 2 (SUTURE) ×2 IMPLANT
SUT VIC AB 0 CT1 27 (SUTURE) ×1
SUT VIC AB 0 CT1 27XBRD ANBCTR (SUTURE) ×1 IMPLANT
SUT VIC AB 1 CT1 27 (SUTURE) ×1
SUT VIC AB 1 CT1 27XBRD ANBCTR (SUTURE) ×1 IMPLANT
SUT VIC AB 2-0 CT1 27 (SUTURE) ×1
SUT VIC AB 2-0 CT1 TAPERPNT 27 (SUTURE) ×1 IMPLANT
SUT VICRYL 0 UR6 27IN ABS (SUTURE) IMPLANT
SUTURE FIBERWR #2 38 T-5 BLUE (SUTURE) ×1 IMPLANT
SUTURE FIBERWR 2-0 18 17.9 3/8 (SUTURE) ×4 IMPLANT
SYR 20CC LL (SYRINGE) ×4 IMPLANT
SYR 3ML LL SCALE MARK (SYRINGE) ×2 IMPLANT
SYR TB 1ML LUER SLIP (SYRINGE) ×2 IMPLANT
TAPE CLOTH SURG 6X10 WHT LF (GAUZE/BANDAGES/DRESSINGS) ×2 IMPLANT
TOWEL OR 17X24 6PK STRL BLUE (TOWEL DISPOSABLE) ×2 IMPLANT
TOWEL OR 17X26 10 PK STRL BLUE (TOWEL DISPOSABLE) ×2 IMPLANT
WAND 90 DEG TURBOVAC W/CORD (SURGICAL WAND) ×2 IMPLANT
WATER STERILE IRR 1000ML POUR (IV SOLUTION) IMPLANT

## 2011-07-13 NOTE — Op Note (Signed)
[name], Hinton               ACCOUNT NO.:  [id number]  MEDICAL RECORD NO.:  [id number]  LOCATION:  5017                         FACILITY:  [facility]  PHYSICIAN:  Burnard Bunting, M.D.    DATE OF BIRTH:  [birth year]  DATE OF PROCEDURE:  07/13/2011 DATE OF DISCHARGE:                              OPERATIVE REPORT   PREOPERATIVE DIAGNOSIS:  Right shoulder biceps tendon tear, synovitis, rotator cuff tear, and bursitis.  POSTOPERATIVE DIAGNOSES: 1. Right shoulder rotator cuff tear, V-shaped, measuring about 1.5 x 2     cm. 2. Intra-articular splitting of the biceps tendon. 3. Early synovitis above the biceps anchor. 4. Subacromial spurring and bursitis.  PROCEDURE:  Right shoulder diagnostic arthroscopy, biceps tendon release, debridement of labrum and subacromial decompression with release of the CA ligament, open biceps tenodesis, and open rotator cuff repair.  SURGEON:  Burnard Bunting, M.D.  ASSISTANT:  Marshia Ly, P.A.  ANESTHESIA:  General endotracheal.  ESTIMATED BLOOD LOSS:  50 mL.  DRAINS:  None.  INDICATION:  Brenda Hinton is a 60 year old patient with right shoulder pain, who presents for operative management after explanation of risks and benefits of rotator cuff tear, biceps tendonitis and bursitis.  OPERATIVE FINDINGS: 1. Examination under anesthesia, range of motion.  She had a good     forward flexion of 170, external rotation 50 degrees, abduction is     about 60, and glenohumeral abduction is about 95. 2. Diagnostic arthroscopy.     a.     Split tearing of the biceps tendon intra-articularly      associated with unstable SLAP tear.     b.     Rotator cuff tear measuring 1.2 x 2 cm V-shaped.     c.     Bursitis and subacromial spur.  PROCEDURE IN DETAIL:  The patient was brought to the operating room, where general tracheal anesthesia was induced.  Preoperative antibiotics were administered.  The right shoulder was examined under anesthesia. Time-out  was called.  The patient was placed in a beach-chair position with the head in a neutral position.  Right arm and hand was prescribed with alcohol and Betadine scrub, which was allowed to air dry and prepped with DuraPrep solution and draped in a sterile manner.  Collier Flowers was used to cover the axilla and the most of the operative field. Subacromial injection was performed with saline and epinephrine.  The shoulder joint injection was performed with saline.  The scope was placed in a posterior portal 2 cm medial and inferior to the posterolateral margin of the acromion.  Anterior portal was created under direct visualization.  Diagnostic arthroscopy demonstrated tearing of the biceps tendon, unstable flap tear, rotator cuff tear, and synovitis above the biceps anchor.  Biceps tendon was released. Synovitis was debrided with the ArthroCare wand.  Rotator cuff tear was visualized and found to be repairable.  Scope was placed in the subacromial space.  Anterolateral portal was created.  Subacromial decompression was then performed and release of the CA ligament was performed.  At this time, the anterior and posterior portals were closed.  Collier Flowers was then used to cover the operative field.  An incision was made  off the anterolateral margin of the acromion.  Skin and subcutaneous tissue sharply divided.  The deltoid was split between the anterior and middle raphe at about 4 cm.  The apex was marked with a #1 Vicryl suture and rotator cuff tear was identified.  A footprint was freshened with a curette, was then repaired by first closing the V with inverted 2-0 FiberWire sutures, followed by one 55 Corkscrew Suture Anchor.  The rotator cuff tear was then secured using 2 push locks down below the footprint.  The transverse humeral ligament was then incised on its medial portion and biceps tendon was then grasped with a fiber loop suture, was then tenodesed into the bicipital groove using a 7 x 23 mm  bioabsorbable interference screw.  Appropriate tension was made.  At this time, the incisions were thoroughly irrigated.  The deltoid split was then repaired using 0 Vicryl suture.  Skin was closed using interrupted inverted 2-0 Vicryl suture and a running 3-0 pull-out Prolene.  Bulky dressing and shoulder immobilizer were placed.  The patient tolerated the procedure well without immediate complications.     Burnard Bunting, M.D.     GSD/MEDQ  D:  07/13/2011  T:  07/13/2011  Job:  [id number]

## 2011-07-13 NOTE — Interval H&P Note (Signed)
History and Physical Interval Note:  07/13/2011 7:29 AM  Brenda Hinton  has presented today for surgery, with the diagnosis of Right shoulder rotator cuff tear, bursitis, biceps tendonitis  The various methods of treatment have been discussed with the patient and family. After consideration of risks, benefits and other options for treatment, the patient has consented to  Procedure(s): SHOULDER ARTHROSCOPY WITH ROTATOR CUFF REPAIR AND SUBACROMIAL DECOMPRESSION as a surgical intervention .  The patients' history has been reviewed, patient examined, no change in status, stable for surgery.  I have reviewed the patients' chart and labs.  Questions were answered to the patient's satisfaction.     [name] SCOTT

## 2011-07-13 NOTE — Preoperative (Signed)
Beta Blockers   Reason not to administer Beta Blockers:Not Applicable 

## 2011-07-13 NOTE — Anesthesia Postprocedure Evaluation (Signed)
  Anesthesia Post-op Note  Patient: Brenda Hinton  Procedure(s) Performed:  SHOULDER ARTHROSCOPY WITH ROTATOR CUFF REPAIR AND SUBACROMIAL DECOMPRESSION - Right shoulder diagnostic operative arthroscopy, subacromial decompression, mini-open rotator cuff tear repair, biceps tenodesis  Patient Location: PACU  Anesthesia Type: GA combined with regional for post-op pain  Level of Consciousness: awake, oriented, sedated and patient cooperative  Airway and Oxygen Therapy: Patient Spontanous Breathing and Patient connected to nasal cannula oxygen  Post-op Pain: moderate  Post-op Assessment: Post-op Vital signs reviewed, Patient's Cardiovascular Status Stable, Respiratory Function Stable, Patent Airway, No signs of Nausea or vomiting and Pain level controlled  Post-op Vital Signs: stable  Complications: No apparent anesthesia complications

## 2011-07-13 NOTE — Transfer of Care (Signed)
Immediate Anesthesia Transfer of Care Note  Patient: Brenda Hinton  Procedure(s) Performed:  SHOULDER ARTHROSCOPY WITH ROTATOR CUFF REPAIR AND SUBACROMIAL DECOMPRESSION - Right shoulder diagnostic operative arthroscopy, subacromial decompression, mini-open rotator cuff tear repair, biceps tenodesis  Patient Location: PACU  Anesthesia Type: General  Level of Consciousness: awake and alert   Airway & Oxygen Therapy: Patient Spontanous Breathing and Patient connected to face mask oxygen  Post-op Assessment: Report given to PACU RN  Post vital signs: Reviewed and stable  Complications: No apparent anesthesia complications

## 2011-07-13 NOTE — Anesthesia Preprocedure Evaluation (Addendum)
Anesthesia Evaluation  Patient identified by MRN, date of birth, ID band Patient awake    Reviewed: Allergy & Precautions, H&P , NPO status , Patient's Chart, lab work & pertinent test results  History of Anesthesia Complications Negative for: history of anesthetic complications  Airway Mallampati: II TM Distance: >3 FB Neck ROM: full    Dental  (+) Poor Dentition, Teeth Intact and Missing   Pulmonary shortness of breath and with exertion, Continuous Positive Airway Pressure Ventilation , COPDformer smoker   Pulmonary exam normal       Cardiovascular hypertension, Pt. on medications regular Normal    Neuro/Psych  Headaches,    GI/Hepatic GERD-  Medicated and Controlled,  Endo/Other  Negative Endocrine ROS  Renal/GU negative Renal ROSARF in June 2012 due to dehydration from IBS; pt rehydrated --> ARF resolved  Genitourinary negative   Musculoskeletal   Abdominal   Peds  Hematology   Anesthesia Other Findings   Reproductive/Obstetrics                        Anesthesia Physical Anesthesia Plan  ASA: III  Anesthesia Plan: General   Post-op Pain Management:    Induction: Intravenous  Airway Management Planned: Oral ETT  Additional Equipment:   Intra-op Plan:   Post-operative Plan:   Informed Consent: I have reviewed the patients History and Physical, chart, labs and discussed the procedure including the risks, benefits and alternatives for the proposed anesthesia with the patient or authorized representative who has indicated his/her understanding and acceptance.     Plan Discussed with: Anesthesiologist, CRNA and Surgeon  Anesthesia Plan Comments:         Anesthesia Quick Evaluation

## 2011-07-13 NOTE — Brief Op Note (Signed)
07/13/2011  10:19 AM  PATIENT:  Brenda Hinton  60 y.o. female  PRE-OPERATIVE DIAGNOSIS:  Right shoulder rotator cuff tear, bursitis, biceps tendonitis  POST-OPERATIVE DIAGNOSIS:  Right shoulder rotator cuff tear, bursitis, biceps tendonitis  PROCEDURE:  Procedure(s): SHOULDER ARTHROSCOPY WITH ROTATOR CUFF REPAIR AND SUBACROMIAL DECOMPRESSION,debridement and open biceps tenodesis  SURGEON:  Surgeon(s): Corrie Mckusick Yong Wahlquist  ASSISTANT: Patrick Jupiter rnfa  ANESTHESIA:   regional and general  EB75 ml    Total I/O In: 2000 [I.V.:2000] Out: -   BLOOD ADMINISTERED: none  DRAINS: none   LOCAL MEDICATIONS USED:  none  SPECIMEN:  No Specimen  COUNTS:  YES  TOURNIQUET:  * No tourniquets in log *  DICTATION: .Other Dictation: Dictation Number [id number]  PLAN OF CARE: Admit to inpatient   PATIENT DISPOSITION:  PACU - hemodynamically stable

## 2011-07-13 NOTE — Anesthesia Procedure Notes (Addendum)
Anesthesia Regional Block:  Interscalene brachial plexus block  Pre-Anesthetic Checklist: ,, timeout performed, Correct Patient, Correct Site, Correct Laterality, Correct Procedure, Correct Position, site marked, Risks and benefits discussed,  Surgical consent,  Pre-op evaluation,  At surgeon's request and post-op pain management  Laterality: Right  Prep: chloraprep       Needles:  Injection technique: Single-shot  Needle Type: Stimulator Needle - 40        Needle insertion depth: 4 cm   Additional Needles:  Procedures: nerve stimulator Interscalene brachial plexus block  Nerve Stimulator or Paresthesia:  Response: 0.6 mA, 0.1 ms, 6 cm  Additional Responses:   Narrative:  Start time: 07/13/2011 7:10 AM End time: 07/13/2011 7:14 AM Injection made incrementally with aspirations every 5 mL.  Performed by: Personally   Additional Notes: 15cc 0.5% Marcaine w/o difficulty or discomfort   Procedure Name: Intubation Date/Time: 07/13/2011 7:48 AM Performed by: Romie Minus Pre-anesthesia Checklist: Patient identified, Emergency Drugs available, Suction available and Patient being monitored Patient Re-evaluated:Patient Re-evaluated prior to inductionOxygen Delivery Method: Circle System Utilized Preoxygenation: Pre-oxygenation with 100% oxygen Intubation Type: IV induction Ventilation: Mask ventilation without difficulty Laryngoscope Size: Miller and 2 Grade View: Grade I Tube type: Oral Tube size: 7.5 mm Number of attempts: 1 Placement Confirmation: ETT inserted through vocal cords under direct vision and positive ETCO2 Secured at: 22 cm Tube secured with: Tape Dental Injury: Teeth and Oropharynx as per pre-operative assessment

## 2011-07-14 MED ORDER — OXYCODONE-ACETAMINOPHEN 5-325 MG PO TABS: 1.0000 | ORAL_TABLET | ORAL | Status: AC | PRN

## 2011-07-14 MED ORDER — METHOCARBAMOL 500 MG PO TABS: 500.0000 mg | ORAL_TABLET | Freq: Three times a day (TID) | ORAL | Status: AC

## 2011-07-14 MED ORDER — OXYCODONE-ACETAMINOPHEN 5-325 MG PO TABS
2.0000 | ORAL_TABLET | Freq: Once | ORAL | Status: AC
  Administered 2011-07-14: 2 via ORAL
  Filled 2011-07-14: qty 2

## 2011-07-14 MED ORDER — OXYCODONE-ACETAMINOPHEN 5-325 MG PO TABS: 1.0000 | ORAL_TABLET | ORAL | Status: DC | PRN

## 2011-07-14 NOTE — Progress Notes (Signed)
Pt stable - pain controlled - dressing dry Dc today

## 2011-07-14 NOTE — Progress Notes (Signed)
OT Shoulder Evaluation   Orders received for OT shoulder consult, eval completed and place in hard chart in health history and assessment section of chart.  Pt has been educated on positioning, sling wear, pendulums, bathing and dressing, and PROM exercises for flexion and abduction.  Pt/family able to return demonstrate independently.  No further acute OT issues.  Recommend follow-up OT per outpatient protocol  Perrin Maltese, OTR/L Pager number [phone] 07/14/2011

## 2011-07-16 NOTE — Discharge Summary (Signed)
Physician Discharge Summary  Patient ID: Brenda Hinton MRN: [id number] DOB/AGE: [birth year] 60 y.o.  Admit date: 07/13/2011 Discharge date: 07/14/2011  Admission Diagnoses:  Active Problems:  * No active hospital problems. *    Discharge Diagnoses:  Same  Surgeries: Procedure(s): SHOULDER ARTHROSCOPY WITH ROTATOR CUFF REPAIR AND SUBACROMIAL DECOMPRESSION on 07/13/2011   Consultants:    Discharged Condition: Stable  Hospital Course: Brenda Hinton is an 60 y.o. female who was admitted 07/13/2011 with a chief complaint of right shoulder pain, and found to have a diagnosis of rctear They were brought to the operating room on 07/13/2011 and underwent the above named procedures.    Antibiotics given:  Anti-infectives     Start     Dose/Rate Route Frequency Ordered Stop   07/13/11 1545   ceFAZolin (ANCEF) IVPB 1 g/50 mL premix        1 g 100 mL/hr over 30 Minutes Intravenous 3 times per day 07/13/11 1532 07/14/11 0707   07/12/11 1445   ceFAZolin (ANCEF) IVPB 2 g/50 mL premix        2 g 100 mL/hr over 30 Minutes Intravenous 60 min pre-op 07/12/11 1438 07/13/11 0730        .  Recent vital signs:  Filed Vitals:   07/14/11 1050  BP: 121/83  Pulse:   Temp:   Resp:     Recent laboratory studies:  Results for orders placed during the hospital encounter of 07/07/11  BASIC METABOLIC PANEL      Component Value Range   Sodium 142  135 - 145 (mEq/L)   Potassium 3.2 (*) 3.5 - 5.1 (mEq/L)   Chloride 105  96 - 112 (mEq/L)   CO2 27  19 - 32 (mEq/L)   Glucose, Bld 108 (*) 70 - 99 (mg/dL)   BUN 12  6 - 23 (mg/dL)   Creatinine, Ser 4.09  0.50 - 1.10 (mg/dL)   Calcium 9.3  8.4 - 81.1 (mg/dL)   GFR calc non Af Amer 74 (*) >90 (mL/min)   GFR calc Af Amer 86 (*) >90 (mL/min)  CBC      Component Value Range   WBC 8.1  4.0 - 10.5 (K/uL)   RBC 4.16  3.87 - 5.11 (MIL/uL)   Hemoglobin 13.1  12.0 - 15.0 (g/dL)   HCT 91.4  78.2 - 95.6 (%)   MCV 96.6  78.0 - 100.0 (fL)   MCH 31.5   26.0 - 34.0 (pg)   MCHC 32.6  30.0 - 36.0 (g/dL)   RDW 21.3  08.6 - 57.8 (%)   Platelets 295  150 - 400 (K/uL)  SURGICAL PCR SCREEN      Component Value Range   MRSA, PCR NEGATIVE  NEGATIVE    Staphylococcus aureus NEGATIVE  NEGATIVE     Discharge Medications:   Discharge Medication List as of 07/14/2011 12:59 PM    START taking these medications   Details  methocarbamol (ROBAXIN) 500 MG tablet Take 1 tablet (500 mg total) by mouth 3 (three) times daily., Starting 07/14/2011, Until Sat 07/24/11, Print    oxyCODONE-acetaminophen (PERCOCET) 5-325 MG per tablet Take 1-2 tablets by mouth every 4 (four) hours as needed., Starting 07/14/2011, Until Sat 07/24/11, Print      CONTINUE these medications which have NOT CHANGED   Details  cholecalciferol (VITAMIN D) 400 UNITS TABS Take 800 Units by mouth 2 (two) times daily.  , Until Discontinued, Historical Med    cholestyramine (QUESTRAN) 4 G packet Take 1  packet by mouth daily as needed.  , Until Discontinued, Historical Med    clonazePAM (KLONOPIN) 0.5 MG tablet Take 0.25-0.5 mg by mouth 2 (two) times daily as needed. For anxiety.  1/2 tab in the morning 1 tab in the evening , Until Discontinued, Historical Med    diclofenac (VOLTAREN) 75 MG EC tablet Take 75 mg by mouth 2 (two) times daily.  , Until Discontinued, Historical Med    flurazepam (DALMANE) 15 MG capsule Take 15 mg by mouth at bedtime as needed. For sleep , Until Discontinued, Historical Med    lisinopril-hydrochlorothiazide (PRINZIDE,ZESTORETIC) 20-12.5 MG per tablet Take 1 tablet by mouth daily.  , Until Discontinued, Historical Med    omeprazole (PRILOSEC) 20 MG capsule Take 20 mg by mouth daily.  , Until Discontinued, Historical Med    phentermine 37.5 MG capsule Take 37.5 mg by mouth every morning.  , Until Discontinued, Historical Med    sertraline (ZOLOFT) 100 MG tablet Take 100 mg by mouth daily.  , Until Discontinued, Historical Med    topiramate (TOPAMAX) 25 MG  tablet Take 50 mg by mouth daily.  , Until Discontinued, Historical Med        Diagnostic Studies:  Chest 2 View  07/07/2011  *RADIOLOGY REPORT*  Clinical Data: Right rotator cuff tear, preop.  CHEST - 2 VIEW  Comparison: 06/10/2009  Findings: Heart and mediastinal contours are within normal limits. No focal opacities or effusions.  No acute bony abnormality.  IMPRESSION: No active cardiopulmonary disease.  Original Report Authenticated By: Cyndie Chime, M.D.   Mr Shoulder Right Wo Contrast  06/23/2011  *RADIOLOGY REPORT*  Clinical Data: Right shoulder pain for 3-4 months.  MRI OF THE RIGHT SHOULDER WITHOUT CONTRAST  Technique:  Multiplanar, multisequence MR imaging was performed. No intravenous contrast was administered.  Comparison: None.  Findings: Despite efforts by the patient and technologist, motion artifact is present on some series of today's examination and could not be totally eliminated.  This reduces diagnostic sensitivity and specificity.  There is a full-thickness partial-width tear of the anterior leading edge of the supraspinatus tendon as shown on image 13 of series 3 and image 7 of series 8. Moderate supraspinatus and infraspinatus tendinopathy is noted, and there is fissuring in the subscapularis tendon.  I doubt that the subscapularis tendon is torn although its upper margin is indistinct.  Moreover, the intra- articular portion of the long head of the biceps is indistinct compatible with tendinopathy or partial tearing.  There is a small but abnormal amount of fluid in the subacromial subdeltoid bursa.  Moderate degenerative acromioclavicular arthropathy noted. The acromial undersurface is type 2 (curved).  Motion artifact precludes sensitive assessment of the labrum, although no gross labral abnormality is observed.  A very densely calcified right axillary lymph node has low T1 and T2 signal as shown on the CT chest from 06/11/2009.  IMPRESSION:  1.  Full-thickness partial-width  tear of the anterior leading edge of the supraspinatus. 2.  Indistinct intra-articular portion the long head of the biceps, suggesting tendinopathy or partial tearing. 3.  Moderate supraspinatus and infraspinatus tendinopathy.  Mild subscapularis tendinopathy. 4.  Chronic densely calcified right axillary lymph node.  This is likely benign. 5.  Moderate degenerative acromioclavicular arthropathy.  Original Report Authenticated By: Dellia Cloud, M.D.    Disposition: Home or Self Care  Discharge Orders    Future Orders Please Complete By Expires   Diet - low sodium heart healthy  Call MD / Call 911      Comments:   If you experience chest pain or shortness of breath, CALL 911 and be transported to the hospital emergency room.  If you develope a fever above 101 F, pus (white drainage) or increased drainage or redness at the wound, or calf pain, call your surgeon's office.   Constipation Prevention      Comments:   Drink plenty of fluids.  Prune juice may be helpful.  You may use a stool softener, such as Colace (over the counter) 100 mg twice a day.  Use MiraLax (over the counter) for constipation as needed.   Increase activity slowly as tolerated      Weight Bearing as taught in Physical Therapy      Comments:   Use a walker or crutches as instructed.   Discharge instructions      Comments:   1. Shoulder cpm  1 hour per 8 hours 2. Keep incision dry 3.No lifting         Signed: DEAN,GREGORY SCOTT 07/16/2011, 5:39 PM

## 2023-01-12 ENCOUNTER — Ambulatory Visit: Payer: BLUE CROSS/BLUE SHIELD | Admitting: Orthopedic Surgery

## 2023-02-02 ENCOUNTER — Ambulatory Visit: Payer: BLUE CROSS/BLUE SHIELD | Admitting: Orthopedic Surgery

## 2023-02-25 ENCOUNTER — Encounter: Payer: Self-pay | Admitting: Orthopedic Surgery

## 2023-02-25 ENCOUNTER — Ambulatory Visit (INDEPENDENT_AMBULATORY_CARE_PROVIDER_SITE_OTHER): Payer: Medicare Other | Admitting: Orthopedic Surgery

## 2023-02-25 DIAGNOSIS — G5621 Lesion of ulnar nerve, right upper limb: Secondary | ICD-10-CM

## 2023-02-25 NOTE — Progress Notes (Signed)
Office Visit Note   Patient: Brenda Hinton           Date of Birth: 11/28/1950           MRN: [id number] Visit Date: 02/25/2023 Requested by: Erven Colla, MD [address] [place],  [place] [address] PCP: System, Provider Not In  Subjective: Chief Complaint  Patient presents with   Right Hand - Pain    HPI: Brenda Hinton is a 72 y.o. female who presents to the office reporting right hand pain numbness and tingling.  Describes numbness and tingling on a daily basis in the small and ring finger of her right hand.  Been going on for a year.  Radiates up the forearm and into the elbow but not above the elbow and does not involve the neck.  Has had prior right shoulder rotator cuff surgery.  She is done well with that.  EMG nerve study is reviewed along with outside medical records.  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  Takes ibuprofen with some relief.  Has a history of bilateral carpal tunnel release as well as neck fusion done in [place] years ago.  She does have diabetes and that is well-controlled by history.  She has lost about 95 pounds on 2 different medications for weight loss.  No cardiac history and no anticoagulants currently.  Here for second opinion regarding her arm symptoms.  This does bother her on a daily basis and does interfere with her sleep on a regular basis..                ROS: All systems reviewed are negative as they relate to the chief complaint within the history of present illness.  Patient denies fevers or chills.  Assessment & Plan: Visit Diagnoses:  1. 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  No subluxation of the nerve.  She does not have any wasting or any loss of motor strength in the hand except for small finger DIP flexion on the right versus left.  Does have paresthesias in the ulnar nerve distribution both dorsal  and palmar aspect of hand.  Plan at this time after lengthy discussion of options is ulnar nerve decompression.  The risk and benefits are discussed with the patient including not limited to infection or vessel damage forearm paresthesias along with incomplete pain relief and possible instability of the nerve.  Patient understands risk and benefits and wishes to proceed.  All questions answered.  Follow-Up Instructions: No follow-ups on file.   Orders:  No orders of the defined types were placed in this encounter.  No orders of the defined types were placed in this encounter.     Procedures: No procedures performed   Clinical Data: No additional findings.  Objective: Vital Signs: There were no vitals taken for this visit.  Physical Exam:  Constitutional: Patient appears well-developed HEENT:  Head: Normocephalic Eyes:EOM are normal Neck: Normal range of motion Cardiovascular: Normal rate Pulmonary/chest: Effort normal Neurologic: Patient is alert Skin: Skin is warm Psychiatric: Patient has normal mood and affect  Ortho Exam: Ortho exam demonstrates good cervical spine range of motion.  Has 5 out of 5 grip EPL FPL interosseous wrist flexion extension bicep tricep and deltoid strength.  No definite interosseous wasting right hand versus left.  Does have slight decrease in small finger DIP flexion right  versus left at 5- out of 5 compared to 5 out of 5.  The rest of the strength exam is symmetric between the right and left hand.  No subluxation of the ulnar nerve with elbow flexion on the right.  Mildly positive Tinel's in this region.  Forearm pronation supination intact and radial pulse intact.  Specialty Comments:  No specialty comments available.  Imaging: No results found.   PMFS History: There are no problems to display for this patient.  Past Medical History:  Diagnosis Date   Acute kidney failure 2011   patient states she was severely dehydrated and went into  acute kidney failure.    Anemia    patient states broderline   Anxiety    Blood transfusion    Cancer (HCC)    Skin Cancer   COPD (chronic obstructive pulmonary disease) (HCC)    Depression    GERD (gastroesophageal reflux disease)    Headache(784.0)    Migraines   Hypertension    Irritable bowel syndrome (IBS)    Shortness of breath    with exertion   Sleep apnea    hx of sleep apnea. does not use CPAP since losing weight.    No family history on file.  Past Surgical History:  Procedure Laterality Date   ABDOMINAL HYSTERECTOMY  1981    partial    BACK SURGERY  2010   Lumbar    CARDIOVASCULAR STRESS TEST  >5 years   CARPAL TUNNEL RELEASE  1998   bilateral    CERVICAL FUSION  2001   CHOLECYSTECTOMY  2011   EYE SURGERY  2010   bilateral cataracts    FINGER SURGERY  1995   Left Thumb Ligament Repair   JOINT REPLACEMENT     left knee: 2004, right knee: 2010   KNEE ARTHROSCOPY  2004   left knee arthoscopy x3   SKIN CANCER EXCISION     25+ removed    TENDON REPAIR     Right Elbow   TONSILLECTOMY     as a child   US ECHOCARDIOGRAPHY  >10 years   Social History   Occupational History   Not on file  Tobacco Use   Smoking status: Former    Current packs/day: 0.00    Types: Cigarettes    Start date: 11/27/1995    Quit date: 11/27/2010    Years since quitting: 12.2   Smokeless tobacco: Never  Substance and Sexual Activity   Alcohol use: No    Comment: patient quit in 2006   Drug use: No   Sexual activity: Not on file

## 2023-04-13 ENCOUNTER — Encounter: Payer: Medicare Other | Admitting: Surgical

## 2023-04-25 ENCOUNTER — Telehealth: Payer: Self-pay

## 2023-04-25 NOTE — Telephone Encounter (Signed)
Patient was scheduled for surgery this morning but case was cancelled due to patient sustaining a fall.  Dr August Saucer would like her rescheduled to next Monday at [facility].

## 2023-05-02 ENCOUNTER — Other Ambulatory Visit: Payer: Self-pay | Admitting: Surgical

## 2023-05-02 ENCOUNTER — Telehealth: Payer: Self-pay | Admitting: Orthopedic Surgery

## 2023-05-02 MED ORDER — TRAMADOL HCL 50 MG PO TABS: 50.0000 mg | ORAL_TABLET | Freq: Four times a day (QID) | ORAL | 0 refills | Status: AC | PRN

## 2023-05-02 NOTE — Telephone Encounter (Signed)
Per Mikayla ok to disregard

## 2023-05-04 ENCOUNTER — Encounter: Payer: Medicare Other | Admitting: Surgical

## 2023-05-11 ENCOUNTER — Ambulatory Visit: Payer: Medicare Other | Admitting: Orthopedic Surgery

## 2023-05-11 DIAGNOSIS — G5621 Lesion of ulnar nerve, right upper limb: Secondary | ICD-10-CM

## 2023-05-13 ENCOUNTER — Encounter: Payer: Self-pay | Admitting: Orthopedic Surgery

## 2023-05-13 NOTE — Progress Notes (Signed)
   Post-Op Visit Note   Patient: Brenda Hinton           Date of Birth: 10-24-1950           MRN: [id number] Visit Date: 05/11/2023 PCP: System, Provider Not In   Assessment & Plan:  Chief Complaint:  Chief Complaint  Patient presents with   Right Elbow - Routine Post Op    R CUBITAL TUNNEL ULNAR NERVE RELEASE (surgery date 05-02-23)   Visit Diagnoses:  1. Ulnar nerve compression, right     Plan: Brenda Hinton is a 72 year old patient underwent right cubital tunnel release 05/02/2023.  She is doing well.  On exam the incision is intact.  Has excellent EPL FPL interosseous strength.  Range of motion is full.  Plan at this time is no lifting for 3 weeks but then activity as tolerated.  Overall she has had very good short-term improvement from her ulnar nerve compression.  I do want her to keep the elbow moving to prevent scarring.  Follow-Up Instructions: No follow-ups on file.   Orders:  No orders of the defined types were placed in this encounter.  No orders of the defined types were placed in this encounter.   Imaging: No results found.  PMFS History: There are no problems to display for this patient.  Past Medical History:  Diagnosis Date   Acute kidney failure 2011   patient states she was severely dehydrated and went into acute kidney failure.    Anemia    patient states broderline   Anxiety    Blood transfusion    Cancer (HCC)    Skin Cancer   COPD (chronic obstructive pulmonary disease) (HCC)    Depression    GERD (gastroesophageal reflux disease)    Headache(784.0)    Migraines   Hypertension    Irritable bowel syndrome (IBS)    Shortness of breath    with exertion   Sleep apnea    hx of sleep apnea. does not use CPAP since losing weight.    History reviewed. No pertinent family history.  Past Surgical History:  Procedure Laterality Date   ABDOMINAL HYSTERECTOMY  1981    partial    BACK SURGERY  2010   Lumbar    CARDIOVASCULAR STRESS TEST  >5 years    CARPAL TUNNEL RELEASE  1998   bilateral    CERVICAL FUSION  2001   CHOLECYSTECTOMY  2011   EYE SURGERY  2010   bilateral cataracts    FINGER SURGERY  1995   Left Thumb Ligament Repair   JOINT REPLACEMENT     left knee: 2004, right knee: 2010   KNEE ARTHROSCOPY  2004   left knee arthoscopy x3   SKIN CANCER EXCISION     25+ removed    TENDON REPAIR     Right Elbow   TONSILLECTOMY     as a child   US ECHOCARDIOGRAPHY  >10 years   Social History   Occupational History   Not on file  Tobacco Use   Smoking status: Former    Current packs/day: 0.00    Types: Cigarettes    Start date: 11/27/1995    Quit date: 11/27/2010    Years since quitting: 12.4   Smokeless tobacco: Never  Substance and Sexual Activity   Alcohol use: No    Comment: patient quit in 2006   Drug use: No   Sexual activity: Not on file
